# Patient Record
Sex: Female | Born: 1980 | Race: White | Hispanic: No | Marital: Married | Smoking: Never smoker
Health system: Southern US, Community
[De-identification: ages and names within clinical notes are randomized; demographics above are authoritative.]

## PROBLEM LIST (undated history)

## (undated) DIAGNOSIS — E282 Polycystic ovarian syndrome: Secondary | ICD-10-CM

## (undated) DIAGNOSIS — I10 Essential (primary) hypertension: Secondary | ICD-10-CM

## (undated) DIAGNOSIS — E079 Disorder of thyroid, unspecified: Secondary | ICD-10-CM

## (undated) DIAGNOSIS — N200 Calculus of kidney: Secondary | ICD-10-CM

## (undated) HISTORY — PX: KIDNEY STONE SURGERY: SHX686

## (undated) HISTORY — PX: THYROIDECTOMY: SHX17

---

## 2014-12-18 ENCOUNTER — Ambulatory Visit
Admission: EM | Admit: 2014-12-18 | Discharge: 2014-12-18 | Disposition: A | Discharge status: Home / Self Care | Payer: BLUE CROSS/BLUE SHIELD | Attending: Family Medicine | Admitting: Family Medicine

## 2014-12-18 ENCOUNTER — Encounter: Payer: Self-pay | Admitting: Emergency Medicine

## 2014-12-18 DIAGNOSIS — R030 Elevated blood-pressure reading, without diagnosis of hypertension: Secondary | ICD-10-CM

## 2014-12-18 DIAGNOSIS — IMO0001 Reserved for inherently not codable concepts without codable children: Secondary | ICD-10-CM

## 2014-12-18 DIAGNOSIS — E669 Obesity, unspecified: Secondary | ICD-10-CM

## 2014-12-18 DIAGNOSIS — E876 Hypokalemia: Secondary | ICD-10-CM | POA: Diagnosis not present

## 2014-12-18 DIAGNOSIS — N390 Urinary tract infection, site not specified: Secondary | ICD-10-CM

## 2014-12-18 HISTORY — DX: Calculus of kidney: N20.0

## 2014-12-18 HISTORY — DX: Polycystic ovarian syndrome: E28.2

## 2014-12-18 LAB — URINALYSIS COMPLETE WITH MICROSCOPIC (ARMC ONLY)
BILIRUBIN URINE: NEGATIVE
Glucose, UA: NEGATIVE mg/dL
KETONES UR: NEGATIVE mg/dL
Nitrite: POSITIVE — AB
PH: 7 (ref 5.0–8.0)
PROTEIN: 100 mg/dL — AB
SPECIFIC GRAVITY, URINE: 1.02 (ref 1.005–1.030)

## 2014-12-18 LAB — CBC WITH DIFFERENTIAL/PLATELET
BASOS ABS: 0.1 10*3/uL (ref 0–0.1)
BASOS PCT: 1 %
EOS ABS: 0.1 10*3/uL (ref 0–0.7)
EOS PCT: 1 %
HCT: 40.9 % (ref 35.0–47.0)
Hemoglobin: 13.5 g/dL (ref 12.0–16.0)
LYMPHS PCT: 20 %
Lymphs Abs: 2.9 10*3/uL (ref 1.0–3.6)
MCH: 29 pg (ref 26.0–34.0)
MCHC: 33.1 g/dL (ref 32.0–36.0)
MCV: 87.6 fL (ref 80.0–100.0)
MONO ABS: 0.9 10*3/uL (ref 0.2–0.9)
Monocytes Relative: 6 %
Neutro Abs: 11.1 10*3/uL — ABNORMAL HIGH (ref 1.4–6.5)
Neutrophils Relative %: 74 %
PLATELETS: 259 10*3/uL (ref 150–440)
RBC: 4.66 MIL/uL (ref 3.80–5.20)
RDW: 13.9 % (ref 11.5–14.5)
WBC: 15.1 10*3/uL — AB (ref 3.6–11.0)

## 2014-12-18 LAB — COMPREHENSIVE METABOLIC PANEL
ALBUMIN: 4.9 g/dL (ref 3.5–5.0)
ALK PHOS: 65 U/L (ref 38–126)
ALT: 18 U/L (ref 14–54)
AST: 21 U/L (ref 15–41)
Anion gap: 11 (ref 5–15)
BILIRUBIN TOTAL: 0.7 mg/dL (ref 0.3–1.2)
BUN: 14 mg/dL (ref 6–20)
CALCIUM: 9.5 mg/dL (ref 8.9–10.3)
CO2: 29 mmol/L (ref 22–32)
Chloride: 99 mmol/L — ABNORMAL LOW (ref 101–111)
Creatinine, Ser: 1.11 mg/dL — ABNORMAL HIGH (ref 0.44–1.00)
GFR calc Af Amer: >60 mL/min (ref >60)
GLUCOSE: 88 mg/dL (ref 65–99)
POTASSIUM: 3.2 mmol/L — AB (ref 3.5–5.1)
Sodium: 139 mmol/L (ref 135–145)
TOTAL PROTEIN: 8.4 g/dL — AB (ref 6.5–8.1)

## 2014-12-18 LAB — HCG, QUANTITATIVE, PREGNANCY

## 2014-12-18 LAB — PREGNANCY, URINE: Preg Test, Ur: NEGATIVE

## 2014-12-18 MED ORDER — SULFAMETHOXAZOLE-TRIMETHOPRIM 800-160 MG PO TABS: 1.0000 | ORAL_TABLET | Freq: Two times a day (BID) | ORAL | Status: DC

## 2014-12-18 MED ORDER — CLONIDINE HCL 0.1 MG PO TABS
0.1000 mg | ORAL_TABLET | Freq: Once | ORAL | Status: AC
  Administered 2014-12-18: 0.1 mg via ORAL

## 2014-12-18 MED ORDER — POTASSIUM CHLORIDE CRYS ER 20 MEQ PO TBCR
40.0000 meq | EXTENDED_RELEASE_TABLET | Freq: Once | ORAL | Status: AC
  Administered 2014-12-18: 40 meq via ORAL

## 2014-12-18 MED ORDER — LISINOPRIL 20 MG PO TABS: 20.0000 mg | ORAL_TABLET | Freq: Every day | ORAL | Status: AC

## 2014-12-18 MED ORDER — PHENAZOPYRIDINE HCL 200 MG PO TABS: 200.0000 mg | ORAL_TABLET | Freq: Three times a day (TID) | ORAL | Status: DC

## 2014-12-18 NOTE — ED Provider Notes (Signed)
CSN: [id number]     Arrival date & time 12/18/14  1222 History   First MD Initiated Contact with Patient 12/18/14 1515     Chief Complaint  Patient presents with  . Back Pain  . Dysuria   (Consider location/radiation/quality/duration/timing/severity/associated sxs/prior Treatment) HPI Comments: Married caucasian female here for evaluation of urinary frequency.  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  Elevated here when nurse took my pressure but I also tend to run high at the doctor's office compared to home  The history is provided by the patient.    Past Medical History  Diagnosis Date  . Kidney stones   . PCOS (polycystic ovarian syndrome)    Past Surgical History  Procedure Laterality Date  . Thyroidectomy     History reviewed. No pertinent family history. Social History  Substance Use Topics  . Smoking status: Never Smoker   . Smokeless tobacco: None  . Alcohol Use: No   OB History    No data available     Review of Systems  Constitutional: Negative for fever, chills, diaphoresis, activity change, appetite change and fatigue.  HENT: Negative for congestion, dental problem, drooling, ear discharge, ear pain, facial swelling, hearing loss, mouth sores, nosebleeds, postnasal drip, rhinorrhea, sinus pressure, sneezing, sore throat, tinnitus, trouble swallowing and voice change.   Eyes: Negative for photophobia, pain, discharge,  redness, itching and visual disturbance.  Respiratory: Negative for cough, choking, chest tightness, shortness of breath, wheezing and stridor.   Cardiovascular: Negative for chest pain, palpitations and leg swelling.  Gastrointestinal: Negative for nausea, vomiting, abdominal pain, diarrhea, constipation, blood in stool, abdominal distention, anal bleeding and rectal pain.  Endocrine: Negative for cold intolerance and heat intolerance.  Genitourinary: Positive for dysuria, urgency, frequency and decreased urine volume. Negative for hematuria, flank pain, vaginal bleeding, vaginal discharge, difficulty urinating, genital sores, vaginal pain and menstrual problem.  Musculoskeletal: Positive for back pain. Negative for myalgias, joint swelling, arthralgias, gait problem, neck pain and neck stiffness.  Skin: Negative for color change, pallor, rash and wound.  Allergic/Immunologic: Negative for environmental allergies and food allergies.  Neurological: Positive for headaches. Negative for dizziness, tremors, seizures, syncope, facial asymmetry, speech difficulty, weakness, light-headedness and numbness.  Hematological: Negative for adenopathy. Does not bruise/bleed easily.  Psychiatric/Behavioral: Negative for behavioral problems, confusion, sleep disturbance and agitation.    Allergies  Advil  Home Medications   Prior to Admission medications   Medication Sig Start Date End Date Taking? Authorizing Provider  lisinopril (PRINIVIL,ZESTRIL) 20 MG tablet Take 1 tablet (20 mg total) by mouth daily. 12/18/14   Barbaraann Barthel, NP  phenazopyridine (PYRIDIUM) 200 MG tablet Take 1 tablet (200 mg total) by mouth 3 (three) times daily. 12/18/14   Barbaraann Barthel, NP  sulfamethoxazole-trimethoprim (BACTRIM DS,SEPTRA DS) 800-160 MG per tablet Take 1 tablet by mouth 2 (two) times daily. 12/18/14   Barbaraann Barthel, NP   Meds Ordered and Administered this Visit  Medications  potassium chloride SA  (K-DUR,KLOR-CON) CR tablet 40 mEq (not administered)  cloNIDine (CATAPRES) tablet 0.1 mg (0.1 mg Oral Given 12/18/14 1709)    BP 177/101 mmHg  Pulse 76  Temp(Src) 96.9 F (36.1 C) (Tympanic)  Resp 16  Ht 5\' 3"  (1.6 m)  Wt 170 lb (77.111 kg)  BMI 30.12 kg/m2  SpO2 100%  LMP 10/11/2014 (Exact Date) No data found.   Physical Exam  Constitutional: She is oriented to person, place, and time. Vital signs are normal. She appears well-developed and well-nourished. No distress.  HENT:  Head: Normocephalic and atraumatic.  Right Ear: Hearing, tympanic membrane, external ear and ear canal normal.  Left Ear: Hearing, tympanic membrane, external ear and ear canal normal.  Nose: Nose normal. No mucosal edema, rhinorrhea, nose lacerations, sinus tenderness, nasal deformity, septal deviation or nasal septal hematoma. No epistaxis.  No foreign bodies. Right sinus exhibits no maxillary sinus tenderness and no frontal sinus tenderness. Left sinus exhibits no maxillary sinus tenderness and no frontal sinus tenderness.  Mouth/Throat: Uvula is midline, oropharynx is clear and moist and mucous membranes are normal. Mucous membranes are not pale, not dry and not cyanotic. She does not have dentures. No oral lesions. No trismus in the jaw. Normal dentition. No dental abscesses, uvula swelling, lacerations or dental caries. No oropharyngeal exudate, posterior oropharyngeal edema, posterior oropharyngeal erythema or tonsillar abscesses.  Eyes: Conjunctivae, EOM and lids are normal. Pupils are equal, round, and reactive to light. Right eye exhibits no discharge. Left eye exhibits no discharge. No scleral icterus.  Neck: Trachea normal and normal range of motion. Neck supple. No tracheal deviation present.  Cardiovascular: Normal rate, regular rhythm, S1 normal, S2 normal, normal heart sounds and intact distal pulses.  PMI is not displaced.  Exam reveals no gallop, no distant heart sounds, no friction rub and no  decreased pulses.   No murmur heard. Pulmonary/Chest: Effort normal and breath sounds normal. No accessory muscle usage or stridor. No respiratory distress. She has no decreased breath sounds. She has no wheezes. She has no rhonchi. She has no rales. She exhibits no tenderness.  Abdominal: Soft. Bowel sounds are normal. She exhibits no shifting dullness, no distension, no pulsatile liver, no fluid wave, no abdominal bruit, no ascites, no pulsatile midline mass and no mass. There is no hepatosplenomegaly. There is no tenderness. There is no rigidity, no rebound, no guarding, no CVA tenderness, no tenderness at McBurney's point and negative Murphy's sign. Hernia confirmed negative in the ventral area.  Dull to percussion x 4 quads  Musculoskeletal: Normal range of motion. She exhibits no edema or tenderness.       Right shoulder: Normal.       Left shoulder: Normal.       Right hip: Normal.       Left hip: Normal.       Right knee: Normal.       Left knee: Normal.       Right ankle: Normal.       Left ankle: Normal.       Cervical back: Normal.       Thoracic back: Normal.       Lumbar back: She exhibits pain. She exhibits normal range of motion, no tenderness, no bony tenderness, no swelling, no edema, no deformity, no laceration, no spasm and normal pulse.       Back:       Right hand: Normal.       Left hand: Normal.  Lymphadenopathy:    She has no cervical adenopathy.  Neurological: She is alert and oriented to person, place, and time. She exhibits normal muscle tone. Coordination normal.  Skin: Skin is warm, dry and intact. No abrasion, no bruising, no burn, no ecchymosis, no laceration, no lesion, no petechiae and no rash noted. She is not diaphoretic. No cyanosis or erythema. No pallor. Nails show no clubbing.  Psychiatric: She has a normal mood and affect. Her speech is normal and behavior is normal. Judgment and thought content normal. Cognition and memory are normal.  Nursing note  and vitals reviewed.   ED Course  Procedures (including critical care time)  Labs Review Labs Reviewed  URINALYSIS COMPLETEWITH MICROSCOPIC ([facility] ONLY) - Abnormal; Notable for the following:    Color, Urine STRAW (*)    APPearance HAZY (*)    Hgb urine dipstick 2+ (*)    Protein, ur 100 (*)    Nitrite POSITIVE (*)    Leukocytes, UA 1+ (*)    Bacteria, UA FEW (*)    Squamous Epithelial / LPF 0-5 (*)    All other components within normal limits  COMPREHENSIVE METABOLIC PANEL - Abnormal; Notable for the following:    Potassium 3.2 (*)    Chloride 99 (*)    Creatinine, Ser 1.11 (*)    Total Protein 8.4 (*)    All other components within normal limits  CBC WITH DIFFERENTIAL/PLATELET - Abnormal; Notable for the following:    WBC 15.1 (*)    Neutro Abs 11.1 (*)    All other components within normal limits  URINE CULTURE  PREGNANCY, URINE  HCG, QUANTITATIVE, PREGNANCY    Imaging Review No results found.  1545 discussed urinalysis results with patient.  Will call with urine culture results once available.  Negative CVA tenderness voiding without difficulty usual urine flow today will hold on CBC and CMP and imaging as pain not her usual kidney stone pain and urine amount not decreased.  Patient with suprapubic discomfort took own tylenol 500mg  po x1 with ice water.  Repeat BP elevated.  Will have patient urinate and rest and repeat blood pressure.  Patient reported she gets anxious at doctor's office and blood pressure always higher in clinic than at home.  Discussed elevated blood pressure/overweight with patient along with weight loss and taking blood pressures at home keeping log and bringing to next PCM appt.  Patient trying to establish care with new PCM has headaches occasionally with elevated blood pressure noted.  Patient verbalized understanding of information/instructions, agreed with plan of care and had no further questions at this time.  1645 patient reported pain greatly  improved, voided again ambulatory to bathroom.  Repeat VS still elevated discussed clonidine 0.1mg  po x 1 now with patient she agreed ordered.  Discussed will need to repeat blood pressure in 15 minutes. Medications  potassium chloride SA (K-DUR,KLOR-CON) CR tablet 40 mEq (not administered)  cloNIDine (CATAPRES) tablet 0.1 mg (0.1 mg Oral Given 12/18/14 1709)   Filed Vitals:   12/18/14 1730  BP: 177/101  Pulse:   Temp:   Resp:      1746  Discussed lab results with patient and given copies of reports negative pregnancy test, elevated white count and hypokalemia.  Repeat BP 171/93 and 66 HR after clonidine.   Ordered kdur po x 1 now for patient.  Decreased po intake/appetite the past couple of days. Patient has drank two glasses of ice water and graham crackers while in clinic.  Patient to monitor BP at home and contact me with the results next Monday sooner if BP greater than 200 SBP or 100 DBP.  Follow up ER if dyspnea, chest pain, unable to void every 8 hours, decreased urine amounts, syncope.  Patient given handout on hypokalemia.  Patient verbalized understanding of information/instructions, agreed with plan of care and had no further questions at this time.   MDM   1. UTI (lower urinary tract infection)   2. Elevated blood pressure   3. Obesity   4. Hypokalemia    Medications as directed.  Patient is also to push fluids and may use Pyridium  po TID as needed.  Hydrate, avoid dehydration.  Avoid holding urine void on frequent basis every 4 to 6 hours.  If unable to void every 8 hours follow up for re-evaluation with PCM, urgent care or ER.   Call or return to clinic as needed if these symptoms worsen or fail to improve as anticipated.  Exitcare handout on cystitis given to patient Patient verbalized agreement and understanding of treatment plan and had no further questions at this time. P2:  Hydrate and cranberry juice  BMI 30 Discussed weight loss e.g. keeping dietary log,  measuring portions, increasing activity, 1500 calories per day for female target 30 pound weight loss over next 6 months.  Exitcare handout on diet and health given to patient.  1 pound weight loss per week.  Patient agreed with plan of care and had no further questions at this time and verbalized understanding of instructions/information.  Continue to monitor blood pressure at home and maintain log of blood pressure and pulse to bring to follow up appointments with PCM.  Discussed if greater than 140/90 recommend starting blood pressure lowering medications.  Start lisinopril  po daily.  Rx given. Call me Sunday/Monday with blood pressure readings at home sooner if new or worsening symptoms after starting lisinopril.  Continue low sodium diet and exercise program.  Recommended weight loss/weight maintenance to BMI 20-25.  Return to the clinic if any new symptoms.  Patient verbalized agreement and understanding of treatment plan and had no further questions at this time.   P2:  Diet and Exercise specific for HTN   Barbaraann Barthel, NP 12/19/14 2109

## 2014-12-18 NOTE — ED Notes (Signed)
Patient c/o burning when urinating and lower back pain since Monday.  Patient denies fevers.

## 2014-12-18 NOTE — Discharge Instructions (Signed)
Obesity Obesity is defined as having too much total body fat and a body mass index (BMI) of 30 or more. BMI is an estimate of body fat and is calculated from your height and weight. Obesity happens when you consume more calories than you can burn by exercising or performing daily physical tasks. Prolonged obesity can cause major illnesses or emergencies, such as:   Stroke.  Heart disease.  Diabetes.  Cancer.  Arthritis.  High blood pressure (hypertension).  High cholesterol.  Sleep apnea.  Erectile dysfunction.  Infertility problems. CAUSES   Regularly eating unhealthy foods.  Physical inactivity.  Certain disorders, such as an underactive thyroid (hypothyroidism), Cushing's syndrome, and polycystic ovarian syndrome.  Certain medicines, such as steroids, some depression medicines, and antipsychotics.  Genetics.  Lack of sleep. DIAGNOSIS  A health care provider can diagnose obesity after calculating your BMI. Obesity will be diagnosed if your BMI is 30 or higher.  There are other methods of measuring obesity levels. Some other methods include measuring your skinfold thickness, your waist circumference, and comparing your hip circumference to your waist circumference. TREATMENT  A healthy treatment program includes some or all of the following:  Long-term dietary changes.  Exercise and physical activity.  Behavioral and lifestyle changes.  Medicine only under the supervision of your health care provider. Medicines may help, but only if they are used with diet and exercise programs. An unhealthy treatment program includes:  Fasting.  Fad diets.  Supplements and drugs. These choices do not succeed in long-term weight control.  HOME CARE INSTRUCTIONS   Exercise and perform physical activity as directed by your health care provider. To increase physical activity, try the following:  Use stairs instead of elevators.  Park farther away from store  entrances.  Garden, bike, or walk instead of watching television or using the computer.  Eat healthy, low-calorie foods and drinks on a regular basis. Eat more fruits and vegetables. Use low-calorie cookbooks or take healthy cooking classes.  Limit fast food, sweets, and processed snack foods.  Eat smaller portions.  Keep a daily journal of everything you eat. There are many free websites to help you with this. It may be helpful to measure your foods so you can determine if you are eating the correct portion sizes.  Avoid drinking alcohol. Drink more water and drinks without calories.  Take vitamins and supplements only as recommended by your health care provider.  Weight-loss support groups, Government social research officer, counselors, and stress reduction education can also be very helpful. SEEK IMMEDIATE MEDICAL CARE IF:  You have chest pain or tightness.  You have trouble breathing or feel short of breath.  You have weakness or leg numbness.  You feel confused or have trouble talking.  You have sudden changes in your vision. MAKE SURE YOU:  Understand these instructions.  Will watch your condition.  Will get help right away if you are not doing well or get worse. Document Released: 05/05/2004 Document Revised: 08/12/2013 Document Reviewed: 05/04/2011 [facility] Patient Information 2015 [place], [place]. This information is not intended to replace advice given to you by your health care provider. Make sure you discuss any questions you have with your health care provider. Hypertension Hypertension, commonly called high blood pressure, is when the force of blood pumping through your arteries is too strong. Your arteries are the blood vessels that carry blood from your heart throughout your body. A blood pressure reading consists of a higher number over a lower number, such as 110/72. The higher  number (systolic) is the pressure inside your arteries when your heart pumps. The lower number  (diastolic) is the pressure inside your arteries when your heart relaxes. Ideally you want your blood pressure below 120/80. Hypertension forces your heart to work harder to pump blood. Your arteries may become narrow or stiff. Having hypertension puts you at risk for heart disease, stroke, and other problems.  RISK FACTORS Some risk factors for high blood pressure are controllable. Others are not.  Risk factors you cannot control include:   Race. You may be at higher risk if you are African American.  Age. Risk increases with age.  Gender. Men are at higher risk than women before age 33 years. After age 13, women are at higher risk than men. Risk factors you can control include:  Not getting enough exercise or physical activity.  Being overweight.  Getting too much fat, sugar, calories, or salt in your diet.  Drinking too much alcohol. SIGNS AND SYMPTOMS Hypertension does not usually cause signs or symptoms. Extremely high blood pressure (hypertensive crisis) may cause headache, anxiety, shortness of breath, and nosebleed. DIAGNOSIS  To check if you have hypertension, your health care provider will measure your blood pressure while you are seated, with your arm held at the level of your heart. It should be measured at least twice using the same arm. Certain conditions can cause a difference in blood pressure between your right and left arms. A blood pressure reading that is higher than normal on one occasion does not mean that you need treatment. If one blood pressure reading is high, ask your health care provider about having it checked again. TREATMENT  Treating high blood pressure includes making lifestyle changes and possibly taking medicine. Living a healthy lifestyle can help lower high blood pressure. You may need to change some of your habits. Lifestyle changes may include:  Following the DASH diet. This diet is high in fruits, vegetables, and whole grains. It is low in salt, red  meat, and added sugars.  Getting at least 2 hours of brisk physical activity every week.  Losing weight if necessary.  Not smoking.  Limiting alcoholic beverages.  Learning ways to reduce stress. If lifestyle changes are not enough to get your blood pressure under control, your health care provider may prescribe medicine. You may need to take more than one. Work closely with your health care provider to understand the risks and benefits. HOME CARE INSTRUCTIONS  Have your blood pressure rechecked as directed by your health care provider.   Take medicines only as directed by your health care provider. Follow the directions carefully. Blood pressure medicines must be taken as prescribed. The medicine does not work as well when you skip doses. Skipping doses also puts you at risk for problems.   Do not smoke.   Monitor your blood pressure at home as directed by your health care provider. SEEK MEDICAL CARE IF:   You think you are having a reaction to medicines taken.  You have recurrent headaches or feel dizzy.  You have swelling in your ankles.  You have trouble with your vision. SEEK IMMEDIATE MEDICAL CARE IF:  You develop a severe headache or confusion.  You have unusual weakness, numbness, or feel faint.  You have severe chest or abdominal pain.  You vomit repeatedly.  You have trouble breathing. MAKE SURE YOU:   Understand these instructions.  Will watch your condition.  Will get help right away if you are not doing well or  get worse. Document Released: 03/28/2005 Document Revised: 08/12/2013 Document Reviewed: 01/18/2013 [facility] Patient Information 2015 [place], [place]. This information is not intended to replace advice given to you by your health care provider. Make sure you discuss any questions you have with your health care provider. Urinary Tract Infection Urinary tract infections (UTIs) can develop anywhere along your urinary tract. Your urinary tract  is your body's drainage system for removing wastes and extra water. Your urinary tract includes two kidneys, two ureters, a bladder, and a urethra. Your kidneys are a pair of bean-shaped organs. Each kidney is about the size of your fist. They are located below your ribs, one on each side of your spine. CAUSES Infections are caused by microbes, which are microscopic organisms, including fungi, viruses, and bacteria. These organisms are so small that they can only be seen through a microscope. Bacteria are the microbes that most commonly cause UTIs. SYMPTOMS  Symptoms of UTIs may vary by age and gender of the patient and by the location of the infection. Symptoms in young women typically include a frequent and intense urge to urinate and a painful, burning feeling in the bladder or urethra during urination. Older women and men are more likely to be tired, shaky, and weak and have muscle aches and abdominal pain. A fever may mean the infection is in your kidneys. Other symptoms of a kidney infection include pain in your back or sides below the ribs, nausea, and vomiting. DIAGNOSIS To diagnose a UTI, your caregiver will ask you about your symptoms. Your caregiver also will ask to provide a urine sample. The urine sample will be tested for bacteria and white blood cells. White blood cells are made by your body to help fight infection. TREATMENT  Typically, UTIs can be treated with medication. Because most UTIs are caused by a bacterial infection, they usually can be treated with the use of antibiotics. The choice of antibiotic and length of treatment depend on your symptoms and the type of bacteria causing your infection. HOME CARE INSTRUCTIONS  If you were prescribed antibiotics, take them exactly as your caregiver instructs you. Finish the medication even if you feel better after you have only taken some of the medication.  Drink enough water and fluids to keep your urine clear or pale yellow.  Avoid  caffeine, tea, and carbonated beverages. They tend to irritate your bladder.  Empty your bladder often. Avoid holding urine for long periods of time.  Empty your bladder before and after sexual intercourse.  After a bowel movement, women should cleanse from front to back. Use each tissue only once. SEEK MEDICAL CARE IF:   You have back pain.  You develop a fever.  Your symptoms do not begin to resolve within 3 days. SEEK IMMEDIATE MEDICAL CARE IF:   You have severe back pain or lower abdominal pain.  You develop chills.  You have nausea or vomiting.  You have continued burning or discomfort with urination. MAKE SURE YOU:   Understand these instructions.  Will watch your condition.  Will get help right away if you are not doing well or get worse. Document Released: 01/05/2005 Document Revised: 09/27/2011 Document Reviewed: 05/06/2011 [facility] Patient Information 2015 [place], [place]. This information is not intended to replace advice given to you by your health care provider. Make sure you discuss any questions you have with your health care provider. Hypokalemia Hypokalemia means that the amount of potassium in the blood is lower than normal.Potassium is a chemical, called an  electrolyte, that helps regulate the amount of fluid in the body. It also stimulates muscle contraction and helps nerves function properly.Most of the body's potassium is inside of cells, and only a very small amount is in the blood. Because the amount in the blood is so small, minor changes can be life-threatening. CAUSES  Antibiotics.  Diarrhea or vomiting.  Using laxatives too much, which can cause diarrhea.  Chronic kidney disease.  Water pills (diuretics).  Eating disorders (bulimia).  Low magnesium level.  Sweating a lot. SIGNS AND SYMPTOMS  Weakness.  Constipation.  Fatigue.  Muscle cramps.  Mental confusion.  Skipped heartbeats or irregular heartbeat  (palpitations).  Tingling or numbness. DIAGNOSIS  Your health care provider can diagnose hypokalemia with blood tests. In addition to checking your potassium level, your health care provider may also check other lab tests. TREATMENT Hypokalemia can be treated with potassium supplements taken by mouth or adjustments in your current medicines. If your potassium level is very low, you may need to get potassium through a vein (IV) and be monitored in the hospital. A diet high in potassium is also helpful. Foods high in potassium are:  Nuts, such as peanuts and pistachios.  Seeds, such as sunflower seeds and pumpkin seeds.  Peas, lentils, and lima beans.  Whole grain and bran cereals and breads.  Fresh fruit and vegetables, such as apricots, avocado, bananas, cantaloupe, kiwi, oranges, tomatoes, asparagus, and potatoes.  Orange and tomato juices.  Red meats.  Fruit yogurt. HOME CARE INSTRUCTIONS  Take all medicines as prescribed by your health care provider.  Maintain a healthy diet by including nutritious food, such as fruits, vegetables, nuts, whole grains, and lean meats.  If you are taking a laxative, be sure to follow the directions on the label. SEEK MEDICAL CARE IF:  Your weakness gets worse.  You feel your heart pounding or racing.  You are vomiting or having diarrhea.  You are diabetic and having trouble keeping your blood glucose in the normal range. SEEK IMMEDIATE MEDICAL CARE IF:  You have chest pain, shortness of breath, or dizziness.  You are vomiting or having diarrhea for more than 2 days.  You faint. MAKE SURE YOU:   Understand these instructions.  Will watch your condition.  Will get help right away if you are not doing well or get worse. Document Released: 03/28/2005 Document Revised: 01/16/2013 Document Reviewed: 09/28/2012 [facility] Patient Information 2015 [place], [place]. This information is not intended to replace advice given to you by your  health care provider. Make sure you discuss any questions you have with your health care provider.

## 2014-12-20 ENCOUNTER — Telehealth: Payer: Self-pay | Admitting: Family Medicine

## 2014-12-20 LAB — URINE CULTURE
Culture: >=100000
SPECIAL REQUESTS: NORMAL

## 2014-12-20 MED ORDER — CIPROFLOXACIN HCL 500 MG PO TABS: 500.0000 mg | ORAL_TABLET | Freq: Two times a day (BID) | ORAL | Status: AC

## 2014-12-21 ENCOUNTER — Encounter: Payer: Self-pay | Admitting: Family Medicine

## 2014-12-21 NOTE — Telephone Encounter (Signed)
-----   Message from [facility], CMA sent at 12/20/2014  3:28 PM EDT ----- Contact: Pt Pt was seen earlier this week, and urine cx was positive for e. Coli. Infection is resistant to abx that were prescribed. Pt reports that her pain is still is present, and is requesting a new antibiotic. Only allergy is to Advil. Allene Dillon, CMA

## 2014-12-21 NOTE — Telephone Encounter (Signed)
Spoke with patient via telephone 20 Dec 2014 and she reported still having back pain and burning with urination.  Blood pressure after taking lisinopril day of appt 153/98 next day 118/83 and 102/72 today 108/70.  Denied dizzyness, headache, visual changes, chest pain, shortness of breath.  Reviewed urine culture results with patient and resistant to macrobid told patient to stop macrobid and called in Rx for cipro  po BID x 7 days.  Discussed with patient if all symptoms resolved could stop after 4 days but if still any dysuria to complete full 7 days course.  Patient has not filled and is not using the pyridium.  Patient to follow up if worsening back pain, fever greater than 100.59F, hematuria, nausea, vomiting, chills after on cipro 48 hours.  Follow up immediately if lethargy, confusion, unable to urinate every 8 hours or if urine tea/cola colored with PCM, [facility] or ER.  Patient verbalized understanding of information/instructions, agreed with plan of care and had no further questions at this time.

## 2017-05-21 ENCOUNTER — Ambulatory Visit
Admission: EM | Admit: 2017-05-21 | Discharge: 2017-05-21 | Disposition: A | Discharge status: Home / Self Care | Payer: BLUE CROSS/BLUE SHIELD | Attending: Family Medicine | Admitting: Family Medicine

## 2017-05-21 ENCOUNTER — Other Ambulatory Visit: Payer: Self-pay

## 2017-05-21 DIAGNOSIS — G44209 Tension-type headache, unspecified, not intractable: Secondary | ICD-10-CM

## 2017-05-21 DIAGNOSIS — I160 Hypertensive urgency: Secondary | ICD-10-CM | POA: Diagnosis not present

## 2017-05-21 DIAGNOSIS — R110 Nausea: Secondary | ICD-10-CM

## 2017-05-21 HISTORY — DX: Essential (primary) hypertension: I10

## 2017-05-21 HISTORY — DX: Disorder of thyroid, unspecified: E07.9

## 2017-05-21 MED ORDER — DIPHENHYDRAMINE HCL 25 MG PO CAPS
25.00 mg | ORAL_CAPSULE | ORAL | Status: DC
Start: 2017-05-21 — End: 2017-05-21

## 2017-05-21 MED ORDER — ACETAMINOPHEN 500 MG PO TABS
1000.00 mg | ORAL_TABLET | ORAL | Status: DC
Start: 2017-05-21 — End: 2017-05-21

## 2017-05-21 MED ORDER — CLONIDINE HCL 0.1 MG PO TABS: 0.1000 mg | ORAL_TABLET | Freq: Once | ORAL | Status: DC

## 2017-05-21 MED ORDER — CLONIDINE HCL 0.2 MG PO TABS
0.2000 mg | ORAL_TABLET | Freq: Once | ORAL | Status: AC
  Administered 2017-05-21: 0.2 mg via ORAL

## 2017-05-21 MED ORDER — ONDANSETRON 8 MG PO TBDP
8.0000 mg | ORAL_TABLET | Freq: Once | ORAL | Status: AC
  Administered 2017-05-21: 8 mg via ORAL

## 2017-05-21 MED ORDER — METOCLOPRAMIDE HCL 5 MG/ML IJ SOLN
10.00 mg | INTRAMUSCULAR | Status: DC
Start: 2017-05-21 — End: 2017-05-21

## 2017-05-21 NOTE — ED Triage Notes (Addendum)
Pt was bowling and she had a headache this a.m. And then went bowling. During bowling headache returned like a band around her head. Headache got really really bad, then she felt nauseated, and then stopped all of a sudden. Then she had right hand numbness which radiated up her right arm and right face. Now reports numbness is mostly to her right hand. Now has headache only on back of head on left side 6/10. PERRL. Hand grips equal, no facial asymmetry, no palmar drift in triage. Some difficulty with words. A&O x4. Pt has been out of her B/P meds x past 6 months.

## 2017-05-21 NOTE — Discharge Instructions (Signed)
Due to degree of blood pressure elevation recommend patient go to Emergency Department for further evaluation and management

## 2017-05-21 NOTE — ED Provider Notes (Signed)
[facility]    CSN: [id number] Arrival date & time: 05/21/17  1539     History   Chief Complaint Chief Complaint  Patient presents with  . Headache    HPI Sarinah Allred is a 37 y.o. female.   The history is provided by the patient.  Headache  Pain location:  Generalized Quality:  Dull Radiates to:  Does not radiate Severity currently:  6/10 Severity at highest:  8/10 Onset quality:  Sudden Duration:  8 hours Timing:  Constant Progression:  Unchanged Chronicity:  New Similar to prior headaches: no   Relieved by:  None tried Ineffective treatments:  None tried Associated symptoms: nausea and photophobia   Associated symptoms: no abdominal pain, no back pain, no blurred vision, no congestion, no cough, no diarrhea, no dizziness, no drainage, no ear pain, no eye pain, no facial pain, no fatigue, no fever, no focal weakness, no hearing loss, no loss of balance, no myalgias, no near-syncope, no neck pain, no neck stiffness, no numbness, no paresthesias, no seizures, no sinus pressure, no sore throat, no swollen glands, no syncope, no tingling, no URI, no visual change, no vomiting and no weakness   Risk factors comment:  Hypertension uncontrolled; patient states has not taken blood pressure medication in the past 6 months   Past Medical History:  Diagnosis Date  . Hypertension   . Kidney stones   . PCOS (polycystic ovarian syndrome)   . Thyroid disease     There are no active problems to display for this patient.   Past Surgical History:  Procedure Laterality Date  . THYROIDECTOMY    . THYROIDECTOMY      OB History    No data available       Home Medications    Prior to Admission medications   Medication Sig Start Date End Date Taking? Authorizing Provider  ciprofloxacin (CIPRO) 500 MG tablet Take 1 tablet (500 mg total) by mouth every 12 (twelve) hours. 12/20/14   Betancourt, Jarold Song, NP  lisinopril (PRINIVIL,ZESTRIL) 20 MG tablet Take 1 tablet  (20 mg total) by mouth daily. 12/18/14   Betancourt, Jarold Song, NP  phenazopyridine (PYRIDIUM) 200 MG tablet Take 1 tablet (200 mg total) by mouth 3 (three) times daily. 12/18/14   Betancourt, Jarold Song, NP    Family History Family History  Problem Relation Age of Onset  . Heart disease Father     Social History Social History   Tobacco Use  . Smoking status: Never Smoker  . Smokeless tobacco: Never Used  Substance Use Topics  . Alcohol use: No  . Drug use: No     Allergies   Advil [ibuprofen]   Review of Systems Review of Systems  Constitutional: Negative for fatigue and fever.  HENT: Negative for congestion, ear pain, hearing loss, postnasal drip, sinus pressure and sore throat.   Eyes: Positive for photophobia. Negative for blurred vision and pain.  Respiratory: Negative for cough.   Cardiovascular: Negative for syncope and near-syncope.  Gastrointestinal: Positive for nausea. Negative for abdominal pain, diarrhea and vomiting.  Musculoskeletal: Negative for back pain, myalgias, neck pain and neck stiffness.  Neurological: Positive for headaches. Negative for dizziness, focal weakness, seizures, weakness, numbness, paresthesias and loss of balance.     Physical Exam Triage Vital Signs ED Triage Vitals  Enc Vitals Group     BP 05/21/17 1602 (!) 195/136     Pulse Rate 05/21/17 1556 (!) 103     Resp 05/21/17 1556 20  Temp 05/21/17 1553 98.2 F (36.8 C)     Temp Source 05/21/17 1553 Oral     SpO2 05/21/17 1556 99 %     Weight 05/21/17 1602 215 lb (97.5 kg)     Height 05/21/17 1602 5\' 3"  (1.6 m)     Head Circumference --      Peak Flow --      Pain Score 05/21/17 1602 6     Pain Loc --      Pain Edu? --      Excl. in GC? --    No data found.  Updated Vital Signs BP (!) 200/120 (BP Location: Left Arm)   Pulse (!) 103   Temp 98.2 F (36.8 C)   Resp 20   Ht 5\' 3"  (1.6 m)   Wt 215 lb (97.5 kg)   SpO2 99%   BMI 38.09 kg/m   Visual Acuity Right Eye  Distance:   Left Eye Distance:   Bilateral Distance:    Right Eye Near:   Left Eye Near:    Bilateral Near:     Physical Exam  Constitutional: She is oriented to person, place, and time. She appears well-developed and well-nourished. No distress.  HENT:  Head: Normocephalic.  Nose: Nose normal.  Mouth/Throat: Oropharynx is clear and moist and mucous membranes are normal.  Eyes: Conjunctivae and EOM are normal. Pupils are equal, round, and reactive to light. Right eye exhibits no discharge. Left eye exhibits no discharge. No scleral icterus.  Neck: Normal range of motion. Neck supple. No JVD present. No tracheal deviation present. No thyromegaly present.  Cardiovascular: Normal rate, regular rhythm, normal heart sounds and intact distal pulses.  No murmur heard. Pulmonary/Chest: Effort normal and breath sounds normal. No stridor. No respiratory distress. She has no wheezes. She has no rales. She exhibits no tenderness.  Abdominal: Soft. Bowel sounds are normal. She exhibits no distension and no mass. There is no tenderness. There is no rebound and no guarding.  Musculoskeletal: She exhibits no edema or tenderness.  Lymphadenopathy:    She has no cervical adenopathy.  Neurological: She is alert and oriented to person, place, and time. She has normal strength and normal reflexes. She is not disoriented. She displays normal reflexes. No cranial nerve deficit or sensory deficit. Coordination and gait normal. GCS eye subscore is 4. GCS verbal subscore is 5. GCS motor subscore is 6.  Skin: Skin is warm and dry. No rash noted. She is not diaphoretic. No erythema. No pallor.  Psychiatric: She has a normal mood and affect. Her behavior is normal. Judgment and thought content normal.  Vitals reviewed.    UC Treatments / Results  Labs (all labs ordered are listed, but only abnormal results are displayed) Labs Reviewed - No data to display  EKG  EKG Interpretation None        Radiology No results found.  Procedures Procedures (including critical care time)  Medications Ordered in UC Medications  ondansetron (ZOFRAN-ODT) disintegrating tablet 8 mg (8 mg Oral Given 05/21/17 1623)  cloNIDine (CATAPRES) tablet 0.2 mg (0.2 mg Oral Given 05/21/17 1622)     Initial Impression / Assessment and Plan / UC Course  I have reviewed the triage vital signs and the nursing notes.  Pertinent labs & imaging results that were available during my care of the patient were reviewed by me and considered in my medical decision making (see chart for details).      Final Clinical Impressions(s) / UC Diagnoses  Final diagnoses:  Hypertensive urgency  Tension-type headache, not intractable, unspecified chronicity pattern    ED Discharge Orders    None     1. diagnosis reviewed with patient 2. Patient given clonidine 0.2mg  po x 1 and zofran 8mg  odt x1 with improvement only in the nausea 3. Due to severity of high blood pressure (not improved) and headache, discussed with patient (and husband) recommend patient go to Emergency Department for further evaluation and management; patient and husband verbalize understanding and will proceed to ED directly from here by private vehicle   Controlled Substance Prescriptions [place] Controlled Substance Registry consulted? Not Applicable   Payton Mccallumonty, Treyshawn Muldrew, MD 05/21/17 [phone]

## 2018-08-30 ENCOUNTER — Emergency Department: Payer: BLUE CROSS/BLUE SHIELD

## 2018-08-30 ENCOUNTER — Other Ambulatory Visit: Payer: Self-pay

## 2018-08-30 ENCOUNTER — Encounter: Payer: Self-pay | Admitting: Emergency Medicine

## 2018-08-30 ENCOUNTER — Emergency Department
Admission: EM | Admit: 2018-08-30 | Discharge: 2018-08-30 | Disposition: A | Discharge status: Home / Self Care | Payer: BLUE CROSS/BLUE SHIELD | Attending: Emergency Medicine | Admitting: Emergency Medicine

## 2018-08-30 DIAGNOSIS — U071 COVID-19: Secondary | ICD-10-CM | POA: Diagnosis not present

## 2018-08-30 DIAGNOSIS — I10 Essential (primary) hypertension: Secondary | ICD-10-CM | POA: Insufficient documentation

## 2018-08-30 DIAGNOSIS — R109 Unspecified abdominal pain: Secondary | ICD-10-CM

## 2018-08-30 DIAGNOSIS — R300 Dysuria: Secondary | ICD-10-CM | POA: Insufficient documentation

## 2018-08-30 DIAGNOSIS — E079 Disorder of thyroid, unspecified: Secondary | ICD-10-CM | POA: Insufficient documentation

## 2018-08-30 DIAGNOSIS — R1032 Left lower quadrant pain: Secondary | ICD-10-CM | POA: Insufficient documentation

## 2018-08-30 DIAGNOSIS — Z79899 Other long term (current) drug therapy: Secondary | ICD-10-CM | POA: Diagnosis not present

## 2018-08-30 DIAGNOSIS — R509 Fever, unspecified: Secondary | ICD-10-CM | POA: Diagnosis present

## 2018-08-30 LAB — CBC WITH DIFFERENTIAL/PLATELET
Abs Immature Granulocytes: 0.01 10*3/uL (ref 0.00–0.07)
Basophils Absolute: 0 10*3/uL (ref 0.0–0.1)
Basophils Relative: 0 %
Eosinophils Absolute: 0 10*3/uL (ref 0.0–0.5)
Eosinophils Relative: 1 %
HCT: 42.9 % (ref 36.0–46.0)
Hemoglobin: 14.3 g/dL (ref 12.0–15.0)
Immature Granulocytes: 0 %
Lymphocytes Relative: 24 %
Lymphs Abs: 1.1 10*3/uL (ref 0.7–4.0)
MCH: 29.1 pg (ref 26.0–34.0)
MCHC: 33.3 g/dL (ref 30.0–36.0)
MCV: 87.4 fL (ref 80.0–100.0)
Monocytes Absolute: 0.6 10*3/uL (ref 0.1–1.0)
Monocytes Relative: 12 %
Neutro Abs: 3 10*3/uL (ref 1.7–7.7)
Neutrophils Relative %: 63 %
Platelets: 210 10*3/uL (ref 150–400)
RBC: 4.91 MIL/uL (ref 3.87–5.11)
RDW: 13.7 % (ref 11.5–15.5)
WBC: 4.8 10*3/uL (ref 4.0–10.5)
nRBC: 0 % (ref 0.0–0.2)

## 2018-08-30 LAB — SARS CORONAVIRUS 2 BY RT PCR (HOSPITAL ORDER, PERFORMED IN ~~LOC~~ HOSPITAL LAB): SARS Coronavirus 2: POSITIVE — AB

## 2018-08-30 LAB — COMPREHENSIVE METABOLIC PANEL
ALT: 37 U/L (ref 0–44)
AST: 39 U/L (ref 15–41)
Albumin: 4.6 g/dL (ref 3.5–5.0)
Alkaline Phosphatase: 63 U/L (ref 38–126)
Anion gap: 12 (ref 5–15)
BUN: 13 mg/dL (ref 6–20)
CO2: 22 mmol/L (ref 22–32)
Calcium: 8.7 mg/dL — ABNORMAL LOW (ref 8.9–10.3)
Chloride: 103 mmol/L (ref 98–111)
Creatinine, Ser: 1.24 mg/dL — ABNORMAL HIGH (ref 0.44–1.00)
GFR calc Af Amer: >60 mL/min (ref >60)
GFR calc non Af Amer: 55 mL/min — ABNORMAL LOW (ref >60)
Glucose, Bld: 114 mg/dL — ABNORMAL HIGH (ref 70–99)
Potassium: 3.9 mmol/L (ref 3.5–5.1)
Sodium: 137 mmol/L (ref 135–145)
Total Bilirubin: 0.7 mg/dL (ref 0.3–1.2)
Total Protein: 8.1 g/dL (ref 6.5–8.1)

## 2018-08-30 LAB — URINALYSIS, COMPLETE (UACMP) WITH MICROSCOPIC
Bacteria, UA: NONE SEEN
Bilirubin Urine: NEGATIVE
Glucose, UA: NEGATIVE mg/dL
Hgb urine dipstick: NEGATIVE
Ketones, ur: 20 mg/dL — AB
Leukocytes,Ua: NEGATIVE
Nitrite: NEGATIVE
Protein, ur: NEGATIVE mg/dL
Specific Gravity, Urine: 1.014 (ref 1.005–1.030)
pH: 5 (ref 5.0–8.0)

## 2018-08-30 LAB — POCT PREGNANCY, URINE: Preg Test, Ur: NEGATIVE

## 2018-08-30 LAB — LIPASE, BLOOD: Lipase: 56 U/L — ABNORMAL HIGH (ref 11–51)

## 2018-08-30 MED ORDER — ONDANSETRON HCL 4 MG/2ML IJ SOLN
4.0000 mg | Freq: Once | INTRAMUSCULAR | Status: AC
  Administered 2018-08-30: 4 mg via INTRAVENOUS
  Filled 2018-08-30: qty 2

## 2018-08-30 MED ORDER — HYDROMORPHONE HCL 1 MG/ML IJ SOLN
0.5000 mg | Freq: Once | INTRAMUSCULAR | Status: AC
  Administered 2018-08-30: 05:00:00 0.5 mg via INTRAVENOUS
  Filled 2018-08-30: qty 1

## 2018-08-30 MED ORDER — ALBUTEROL SULFATE HFA 108 (90 BASE) MCG/ACT IN AERS: 2.0000 | INHALATION_SPRAY | RESPIRATORY_TRACT | 0 refills | Status: AC | PRN

## 2018-08-30 MED ORDER — HYDROCOD POLST-CPM POLST ER 10-8 MG/5ML PO SUER: 5.0000 mL | Freq: Two times a day (BID) | ORAL | 0 refills | Status: AC

## 2018-08-30 MED ORDER — SODIUM CHLORIDE 0.9 % IV BOLUS
1000.0000 mL | Freq: Once | INTRAVENOUS | Status: AC
  Administered 2018-08-30: 05:00:00 1000 mL via INTRAVENOUS

## 2018-08-30 MED ORDER — PHENAZOPYRIDINE HCL 200 MG PO TABS: 200.0000 mg | ORAL_TABLET | Freq: Three times a day (TID) | ORAL | 0 refills | Status: AC | PRN

## 2018-08-30 NOTE — Discharge Instructions (Addendum)
1.  Continue antibiotic until finished. 2.  You may take cough medicine as needed (Tussionex). 3.  You may use albuterol inhaler 2 puffs every 4 hours as needed for cough/wheezing/difficulty breathing. 4.  Take Pyridium for urinary discomfort. 5.  Return to the ER for worsening symptoms, persistent vomiting, difficulty breathing or other concerns.

## 2018-08-30 NOTE — ED Provider Notes (Signed)
[facility] Emergency Department Provider Note   ____________________________________________   First MD Initiated Contact with Patient 08/30/18 [phone]     (approximate)  I have reviewed the triage vital signs and the nursing notes.   HISTORY  Chief Complaint Fever    HPI Jill Olson is a 38 y.o. female who presents to the emergency department from home with a chief complaint of flank pain, fever, and dysuria. Symptoms x 4 days, starting with left flank pain. Started on antibiotic (Levaquin) 2 days ago. Husband tested positive for Covid yesterday (symptoms of fever + cough), not hospitalized.  Patient presents tonight because now she is having bilateral flank pain associated with dysuria; history of kidney stones.  Patient denies cough, chest pain, shortness of breath, abdominal pain, nausea or vomiting.  Denies recent travel or trauma.       Past Medical History:  Diagnosis Date  . Hypertension   . Kidney stones   . PCOS (polycystic ovarian syndrome)   . Thyroid disease     There are no active problems to display for this patient.   Past Surgical History:  Procedure Laterality Date  . KIDNEY STONE SURGERY    . THYROIDECTOMY    . THYROIDECTOMY      Prior to Admission medications   Medication Sig Start Date End Date Taking? Authorizing Provider  albuterol (VENTOLIN HFA) 108 (90 Base) MCG/ACT inhaler Inhale 2 puffs into the lungs every 4 (four) hours as needed for wheezing or shortness of breath. 08/30/18   Irean Hong, MD  chlorpheniramine-HYDROcodone (TUSSIONEX PENNKINETIC ER) 10-8 MG/5ML SUER Take 5 mLs by mouth 2 (two) times daily. 08/30/18   Irean Hong, MD  ciprofloxacin (CIPRO) 500 MG tablet Take 1 tablet (500 mg total) by mouth every 12 (twelve) hours. 12/20/14   Betancourt, Jarold Song, NP  lisinopril (PRINIVIL,ZESTRIL) 20 MG tablet Take 1 tablet (20 mg total) by mouth daily. 12/18/14   Betancourt, Jarold Song, NP  phenazopyridine (PYRIDIUM) 200 MG  tablet Take 1 tablet (200 mg total) by mouth 3 (three) times daily as needed for pain. 08/30/18   Irean Hong, MD    Allergies Advil [ibuprofen]  Family History  Problem Relation Age of Onset  . Heart disease Father     Social History Social History   Tobacco Use  . Smoking status: Never Smoker  . Smokeless tobacco: Never Used  Substance Use Topics  . Alcohol use: No  . Drug use: No    Review of Systems  Constitutional: Positive for low-grade fever. Eyes: No visual changes. ENT: No sore throat. Cardiovascular: Denies chest pain. Respiratory: Denies shortness of breath. Gastrointestinal: Positive for bilateral flank pain.  No abdominal pain.  No nausea, no vomiting.  No diarrhea.  No constipation. Genitourinary: Positive for dysuria.  Negative for vaginal discharge. Musculoskeletal: Negative for back pain. Skin: Negative for rash. Neurological: Negative for headaches, focal weakness or numbness.   ____________________________________________   PHYSICAL EXAM:  VITAL SIGNS: ED Triage Vitals  Enc Vitals Group     BP 08/30/18 0405 (!) 151/106     Pulse Rate 08/30/18 0405 (!) 117     Resp 08/30/18 0405 18     Temp 08/30/18 0405 99.6 F (37.6 C)     Temp Source 08/30/18 0405 Oral     SpO2 08/30/18 0405 94 %     Weight 08/30/18 0400 220 lb (99.8 kg)     Height 08/30/18 0400  (1.575 m)  Head Circumference --      Peak Flow --      Pain Score 08/30/18 0400 6     Pain Loc --      Pain Edu? --      Excl. in GC? --     Constitutional: Alert and oriented. Well appearing and in no acute distress. Eyes: Conjunctivae are normal. PERRL. EOMI. Head: Atraumatic. Nose: No congestion/rhinnorhea. Mouth/Throat: Mucous membranes are moist.  Oropharynx non-erythematous. Neck: No stridor.   Cardiovascular: Tachycardic rate, regular rhythm. Grossly normal heart sounds.  Good peripheral circulation. Respiratory: Normal respiratory effort.  No retractions. Lungs CTAB.  Gastrointestinal: Soft and nontender to light or deep palpation. No distention. No abdominal bruits. No CVA tenderness. Musculoskeletal: No lower extremity tenderness nor edema.  No joint effusions. Neurologic:  Normal speech and language. No gross focal neurologic deficits are appreciated. No gait instability. Skin:  Skin is warm, dry and intact. No rash noted.  No petechiae. Psychiatric: Mood and affect are normal. Speech and behavior are normal.  ____________________________________________   LABS (all labs ordered are listed, but only abnormal results are displayed)  Labs Reviewed  COMPREHENSIVE METABOLIC PANEL - Abnormal; Notable for the following components:      Result Value   Glucose, Bld 114 (*)    Creatinine, Ser 1.24 (*)    Calcium 8.7 (*)    GFR calc non Af Amer 55 (*)    All other components within normal limits  LIPASE, BLOOD - Abnormal; Notable for the following components:   Lipase 56 (*)    All other components within normal limits  URINALYSIS, COMPLETE (UACMP) WITH MICROSCOPIC - Abnormal; Notable for the following components:   Color, Urine YELLOW (*)    APPearance HAZY (*)    Ketones, ur 20 (*)    All other components within normal limits  CULTURE, BLOOD (ROUTINE X 2)  CULTURE, BLOOD (ROUTINE X 2)  URINE CULTURE  SARS CORONAVIRUS 2 (HOSPITAL ORDER, PERFORMED IN [facility])  CBC WITH DIFFERENTIAL/PLATELET  POCT PREGNANCY, URINE  POC URINE PREG, ED   ____________________________________________  EKG  None ____________________________________________  RADIOLOGY  ED MD interpretation: No acute cardiopulmonary process; CT demonstrates no kidney stones, lung bases with groundglass opacities consistent with COVID respiratory infection  Official radiology report(s): Dg Chest Port 1 View  Result Date: 08/30/2018 CLINICAL DATA:  Worsening fever EXAM: PORTABLE CHEST 1 VIEW COMPARISON:  None. FINDINGS: Normal heart size and mediastinal contours.  No acute infiltrate or edema. No effusion or pneumothorax. No acute osseous findings. IMPRESSION: Negative chest. Electronically Signed   By: Marnee SpringJonathon  Watts M.D.   On: 08/30/2018 04:50   Ct Renal Stone Study  Result Date: 08/30/2018 CLINICAL DATA:  Worsening fever since Monday evening.  Flank pain EXAM: CT ABDOMEN AND PELVIS WITHOUT CONTRAST TECHNIQUE: Multidetector CT imaging of the abdomen and pelvis was performed following the standard protocol without IV contrast. COMPARISON:  None. FINDINGS: Lower chest: Subpleural ground-glass densities in the right lower lobe. There are smaller airspace opacities in the left lower lobe not reaching the pleura. In the setting of fever and known close COVID-19 contact (husband) this is most likely coronavirus related. Per the chart the patient is already isolated. Hepatobiliary: Hepatic steatosis.No evidence of biliary obstruction or stone. Pancreas: Unremarkable. Spleen: Unremarkable. Adrenals/Urinary Tract: Negative adrenals. No hydronephrosis or stone. Unremarkable bladder. Stomach/Bowel:  No obstruction. No appendicitis. Vascular/Lymphatic: No acute vascular abnormality. No mass or adenopathy. Reproductive:No pathologic findings. Other: No ascites or pneumoperitoneum. Musculoskeletal: No  acute abnormalities. IMPRESSION: 1. There are a spectrum of findings in the lungs which can be seen with acute atypical infection (as well as other non-infectious etiologies). In particular, viral pneumonia (including COVID-19) should be considered in the appropriate clinical setting. 2. No acute finding in the abdomen. 3. Hepatic steatosis. Electronically Signed   By: Marnee Spring M.D.   On: 08/30/2018 06:32    ____________________________________________   PROCEDURES  Procedure(s) performed (including Critical Care):  Procedures   ____________________________________________   INITIAL IMPRESSION / ASSESSMENT AND PLAN / ED COURSE  As part of my medical decision  making, I reviewed the following data within the electronic MEDICAL RECORD NUMBER Nursing notes reviewed and incorporated, Labs reviewed, Old chart reviewed, Radiograph reviewed and Notes from prior ED visits        38 year old female with history of kidney stones who presents with low-grade fever, dysuria and bilateral flank pain. Differential diagnosis includes, but is not limited to, ovarian cyst, ovarian torsion, acute appendicitis, diverticulitis, urinary tract infection/pyelonephritis, endometriosis, bowel obstruction, colitis, renal colic, gastroenteritis, hernia, fibroids, endometriosis, pregnancy related pain including ectopic pregnancy, etc.  Will obtain basic lab work and urinalysis.  Given her exposure to COVID, will obtain nasal swab and chest x-ray.  Initiate IV fluid resuscitation, IV analgesia and antiemetic.  Will reassess.   Clinical Course as of Aug 29 717  Thu Aug 30, 2018  0527 Patient resting comfortably after IV Dilaudid, talking on her cell phone.   [JS]  [id number] Updated patient via telephone on all test results.  Verbal report by lab that patient is COVID positive.  Will discharge home with prescription for Pyridium, Tussionex and albuterol MDI.  She and her husband are already being monitored by the health department since he tested positive yesterday.  She will notify their caseworker of her positive test result; again patient is not complaining of cough or shortness of breath.  Will continue Levaquin for groundglass opacity seen on CT scan.  Return precautions given.  Patient verbalizes understanding agrees with plan of care.   [JS]    Clinical Course User Index [JS] Irean Hong, MD     ____________________________________________   FINAL CLINICAL IMPRESSION(S) / ED DIAGNOSES  Final diagnoses:  COVID-19 virus infection  Flank pain  Dysuria     ED Discharge Orders         Ordered    chlorpheniramine-HYDROcodone (TUSSIONEX PENNKINETIC ER) 10-8 MG/5ML SUER  2  times daily     08/30/18 0704    phenazopyridine (PYRIDIUM) 200 MG tablet  3 times daily PRN     08/30/18 0704    albuterol (VENTOLIN HFA) 108 (90 Base) MCG/ACT inhaler  Every 4 hours PRN     08/30/18 0704           Note:  This document was prepared using Dragon voice recognition software and may include unintentional dictation errors.   Irean Hong, MD 08/30/18 [phone]

## 2018-08-30 NOTE — ED Triage Notes (Signed)
Pt presents to ED with worsening fever since Monday evening. Pt reports having flank pain and painful urination. Pt was treated with antibiotic by televisit MD but has only taken 2 doses. reports feeling worse tonight. Husband tested positive for covid yesterday.

## 2018-08-30 NOTE — ED Notes (Signed)
Pt able to ambulate around room without apparent distress. Pt O2 92-95% on RA. MD notified.

## 2018-08-30 NOTE — ED Notes (Signed)
Pt discharged to home. Pt reminded to follow up with health department.

## 2018-08-30 NOTE — ED Notes (Addendum)
Dr. Don Perking notified that patient intermittently desats to 89% on RA, pt does not maintain 89% and immediately returns to 92-93% on RA. Per Dr. Don Perking pt instructed to take several deep breaths, cough 2-3 times and then walk around the bed. Will notify MD of results. Pt states she was sleeping in the upon [place], [place] entering room but awakens easily with mild stimuli.

## 2018-08-30 NOTE — ED Notes (Signed)
Pt read for discharge. Upon reviewing discharge instructions, this RN noted that pt had IV dilauded at 0455. Pt states she does not have a ride and needs to drive herself home. Pt instructed that she will need to wait for discharge until 9 am. Pt compliant with instruction.

## 2018-08-31 LAB — URINE CULTURE
Culture: NO GROWTH
Special Requests: NORMAL

## 2018-09-04 LAB — CULTURE, BLOOD (ROUTINE X 2)
Culture: NO GROWTH
Culture: NO GROWTH

## 2019-10-09 IMAGING — CT CT RENAL STONE PROTOCOL
3 of 4 series · 10 of 46 positions shown, 17 images · non-contrast
Comparison: None.

CLINICAL DATA: Worsening fever since [REDACTED] evening.  Flank pain

EXAM:
CT ABDOMEN AND PELVIS WITHOUT CONTRAST
TECHNIQUE: Multidetector CT imaging of the abdomen and pelvis was performed
following the standard protocol without IV contrast.

[Series 5: lung bases · axial · 0.78mm/px · z∈[+31,+136]mm · 6 of 31 slices shown, 11 images]
[im 5/31  soft-tissue]
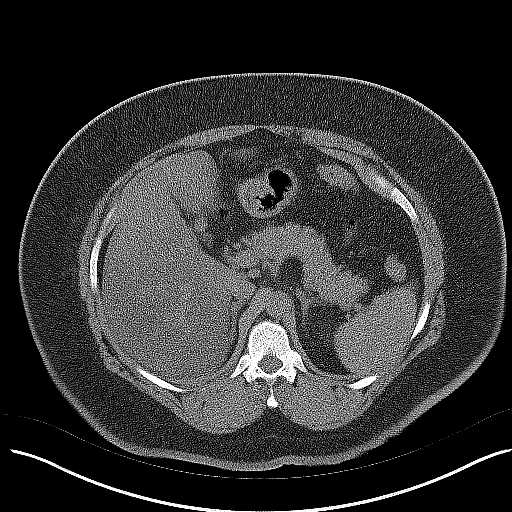
[im 5/31  bone]
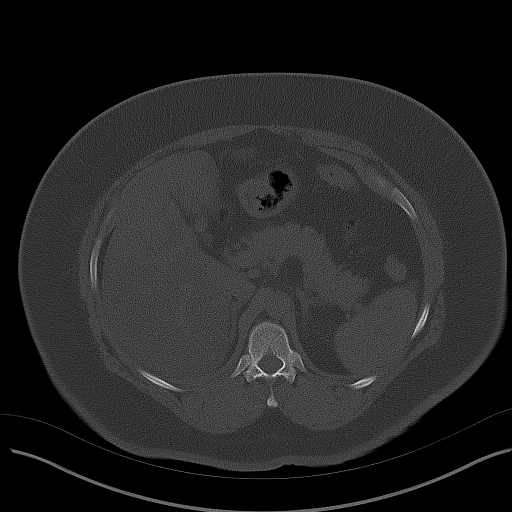
[im 9/31  soft-tissue]
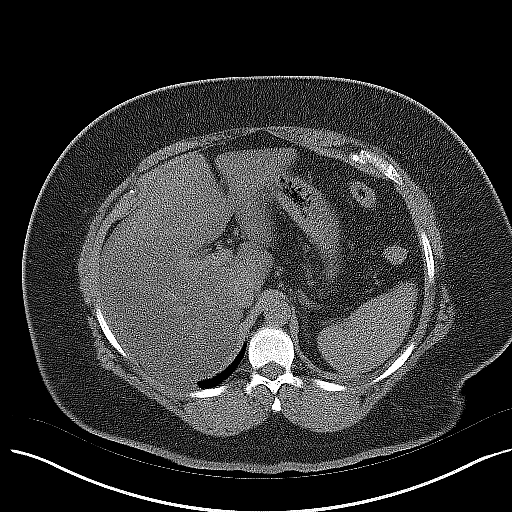
[im 13/31  soft-tissue]
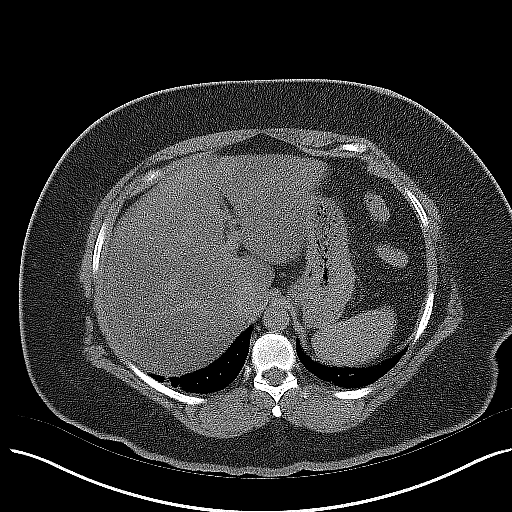
[im 13/31  lung]
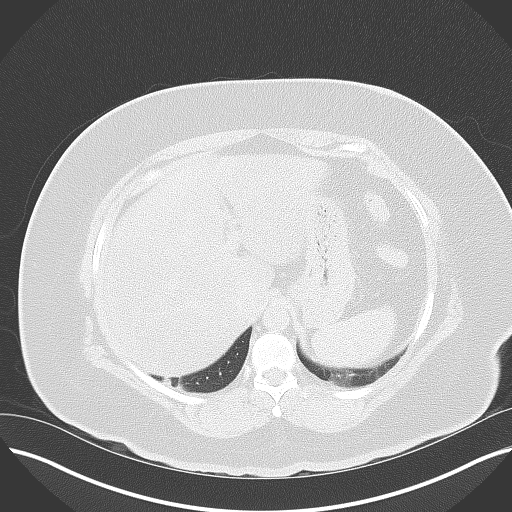
[im 18/31  soft-tissue]
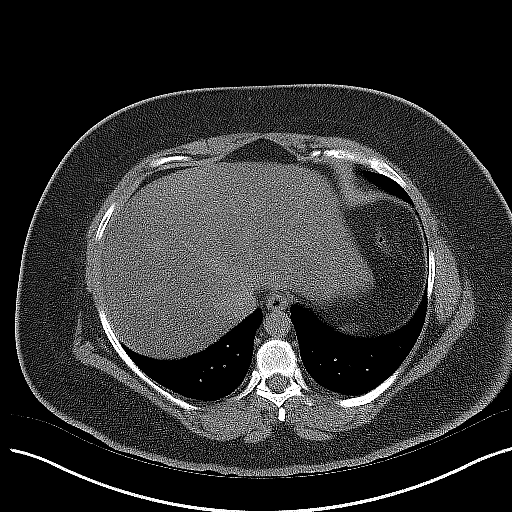
[im 18/31  lung]
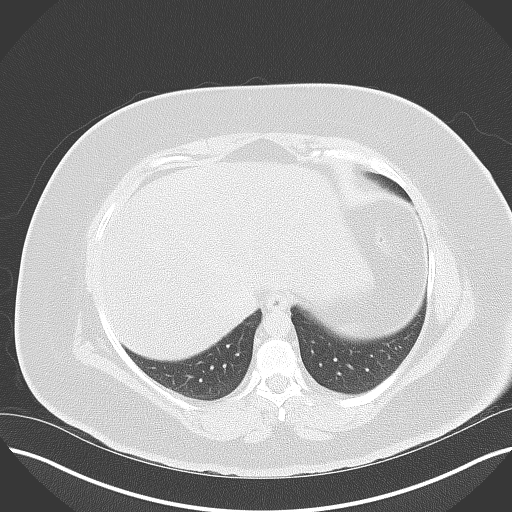
[im 22/31  soft-tissue]
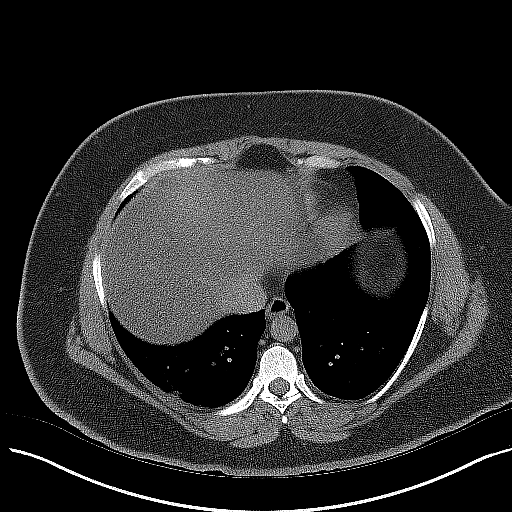
[im 22/31  lung]
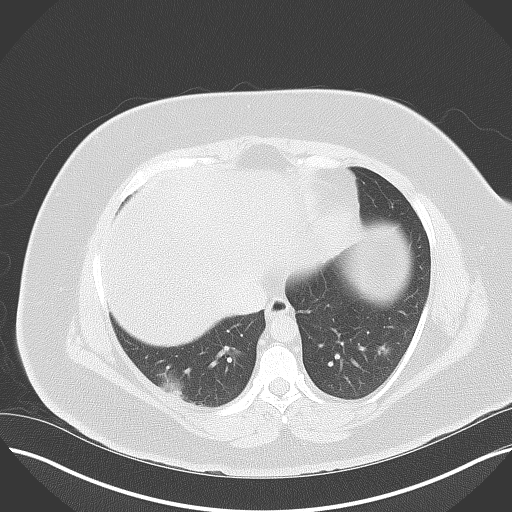
[im 26/31  soft-tissue]
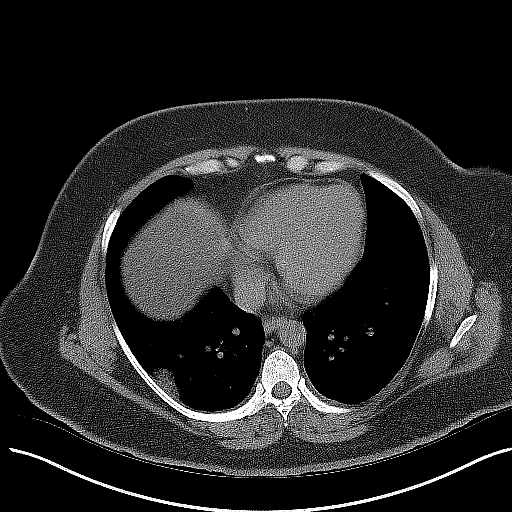
[im 26/31  lung]
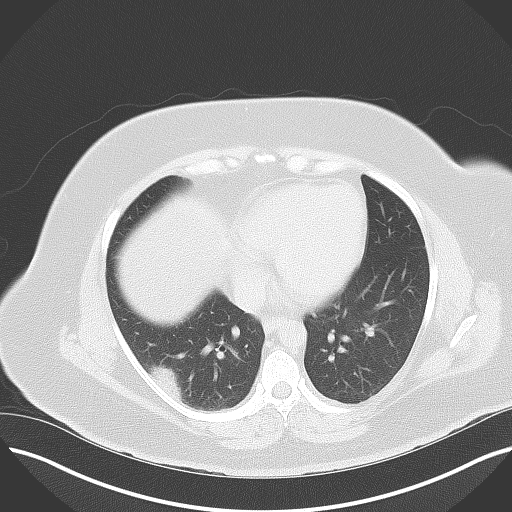

[Series 6: coronal · coronal · 0.88mm/px · 3 of 160 slices shown, 4 images]
[im 54/160  soft-tissue]
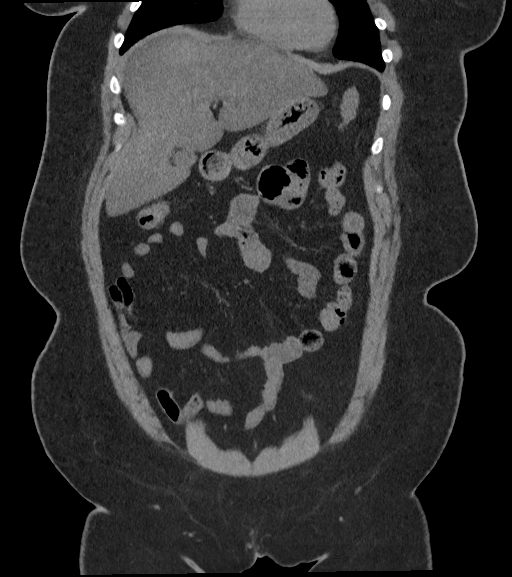
[im 71/160  soft-tissue]
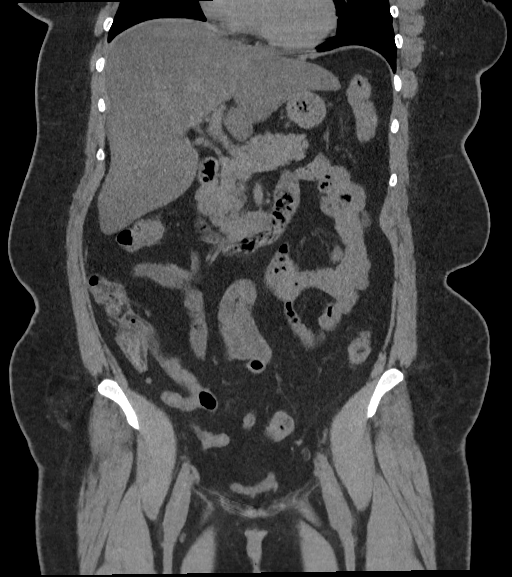
[im 71/160  bone]
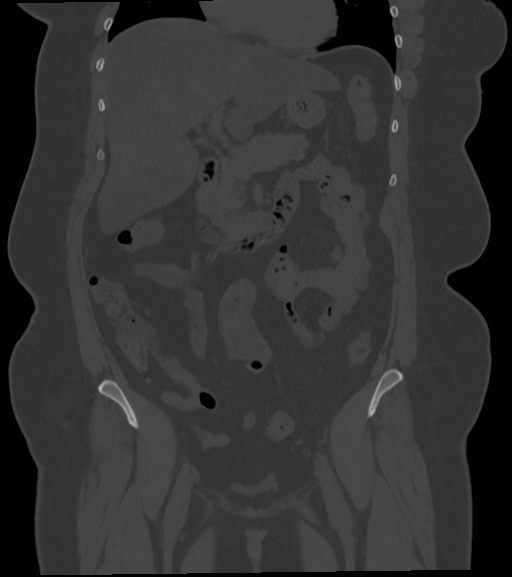
[im 89/160  soft-tissue]
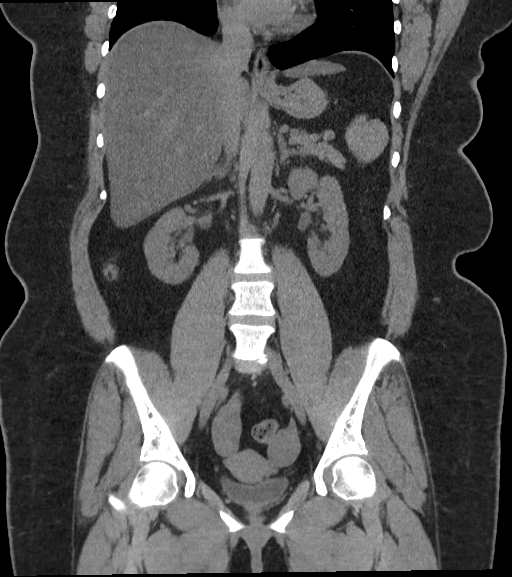

[Series 7: sagittal · sagittal · 0.62mm/px · 1 of 220 slices shown, 2 images]
[im 74/220  soft-tissue]
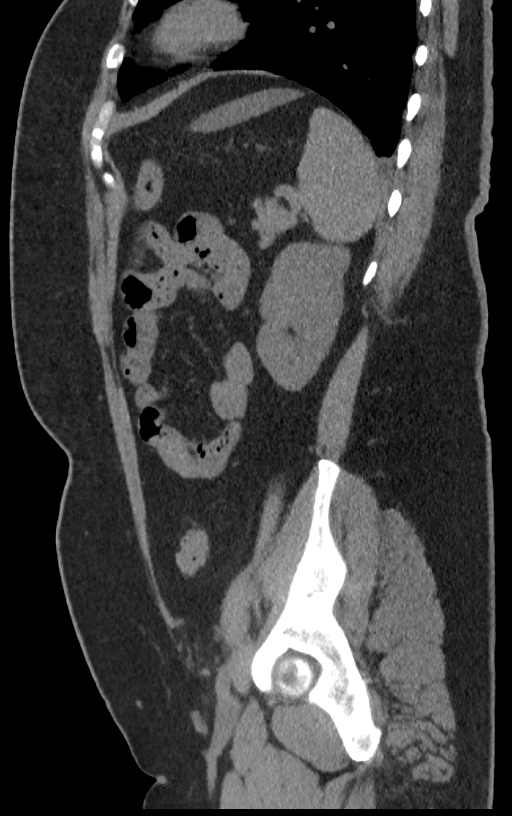
[im 74/220  bone]
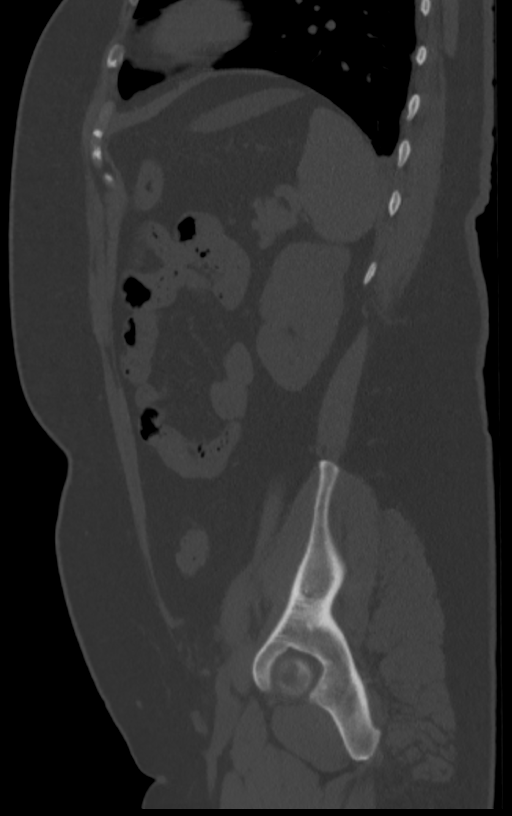

[10 of 46 positions shown; findings below may reference images not displayed]

FINDINGS: Lower chest: Subpleural ground-glass densities in the right lower
lobe. There are smaller airspace opacities in the left lower lobe
not reaching the pleura. In the setting of fever and known close
[id number]-1S contact (husband) this is most likely coronavirus related.
Per the chart the patient is already isolated.

Hepatobiliary: Hepatic steatosis.No evidence of biliary obstruction
or stone.

Pancreas: Unremarkable.

Spleen: Unremarkable.

Adrenals/Urinary Tract: Negative adrenals. No hydronephrosis or
stone. Unremarkable bladder.

Stomach/Bowel:  No obstruction. No appendicitis.

Vascular/Lymphatic: No acute vascular abnormality. No mass or
adenopathy.

Reproductive:No pathologic findings.

Other: No ascites or pneumoperitoneum.

Musculoskeletal: No acute abnormalities.
IMPRESSION: 1. There are a spectrum of findings in the lungs which can be seen
with acute atypical infection (as well as other non-infectious
etiologies). In particular, viral pneumonia (including [id number]-1S)
should be considered in the appropriate clinical setting.
2. No acute finding in the abdomen.
3. Hepatic steatosis.
